# Patient Record
Sex: Male | Born: 1990 | Race: White | Hispanic: No | Marital: Single | Smoking: Never smoker
Health system: Southern US, Community
[De-identification: ages and names within clinical notes are randomized; demographics above are authoritative.]

---

## 2017-03-18 ENCOUNTER — Emergency Department (HOSPITAL_COMMUNITY)
Admission: EM | Admit: 2017-03-18 | Discharge: 2017-03-19 | Disposition: A | Discharge status: Home / Self Care | Payer: Self-pay | Attending: Emergency Medicine | Admitting: Emergency Medicine

## 2017-03-18 ENCOUNTER — Encounter (HOSPITAL_COMMUNITY): Payer: Self-pay | Admitting: *Deleted

## 2017-03-18 DIAGNOSIS — K529 Noninfective gastroenteritis and colitis, unspecified: Secondary | ICD-10-CM | POA: Insufficient documentation

## 2017-03-18 DIAGNOSIS — R195 Other fecal abnormalities: Secondary | ICD-10-CM | POA: Insufficient documentation

## 2017-03-18 LAB — COMPREHENSIVE METABOLIC PANEL
ALK PHOS: 56 U/L (ref 38–126)
ALT: 16 U/L — ABNORMAL LOW (ref 17–63)
ANION GAP: 14 (ref 5–15)
AST: 22 U/L (ref 15–41)
Albumin: 4.7 g/dL (ref 3.5–5.0)
BILIRUBIN TOTAL: 0.9 mg/dL (ref 0.3–1.2)
BUN: 7 mg/dL (ref 6–20)
CALCIUM: 10.1 mg/dL (ref 8.9–10.3)
CO2: 23 mmol/L (ref 22–32)
Chloride: 103 mmol/L (ref 101–111)
Creatinine, Ser: 1.1 mg/dL (ref 0.61–1.24)
GFR calc Af Amer: >60 mL/min (ref >60)
GLUCOSE: 103 mg/dL — AB (ref 65–99)
POTASSIUM: 3.9 mmol/L (ref 3.5–5.1)
Sodium: 140 mmol/L (ref 135–145)
TOTAL PROTEIN: 7.9 g/dL (ref 6.5–8.1)

## 2017-03-18 LAB — URINALYSIS, ROUTINE W REFLEX MICROSCOPIC
Bilirubin Urine: NEGATIVE
Glucose, UA: NEGATIVE mg/dL
Hgb urine dipstick: NEGATIVE
Ketones, ur: 20 mg/dL — AB
LEUKOCYTES UA: NEGATIVE
NITRITE: NEGATIVE
PH: 5 (ref 5.0–8.0)
Protein, ur: NEGATIVE mg/dL
Specific Gravity, Urine: 1.026 (ref 1.005–1.030)

## 2017-03-18 LAB — CBC
HEMATOCRIT: 40.7 % (ref 39.0–52.0)
HEMOGLOBIN: 14.1 g/dL (ref 13.0–17.0)
MCH: 31.1 pg (ref 26.0–34.0)
MCHC: 34.6 g/dL (ref 30.0–36.0)
MCV: 89.8 fL (ref 78.0–100.0)
Platelets: 374 10*3/uL (ref 150–400)
RBC: 4.53 MIL/uL (ref 4.22–5.81)
RDW: 13.1 % (ref 11.5–15.5)
WBC: 10.7 10*3/uL — AB (ref 4.0–10.5)

## 2017-03-18 NOTE — ED Triage Notes (Signed)
The pt is c/o diarrhea for one week a nd today he saw bright red blood in his stool no abd pain now  Only when he has a bm

## 2017-03-18 NOTE — ED Notes (Signed)
Pt reports that he has been having mucous type diarrhea. Pt reports seeing blood on the toilet paper when wipes.

## 2017-03-18 NOTE — ED Provider Notes (Signed)
[facility]-EMERGENCY DEPT Provider Note   CSN: [id number] Arrival date & time: 03/18/17  1922     History   Chief Complaint Chief Complaint  Patient presents with  . Diarrhea    HPI Jordan Esparza is a 26 y.o. male.  Patient presents with 9 day history of diarrhea, occasionally containing bright red blood and mucus, and associated with crampy abdominal pain, rated at 2/10, that resolves after BM. Patient states his stools began as "regular watery diarrhea" but then progressed to being bloody, and have contained mucus in the past 2-3 days. There is also blood when he wipes. The odor has not significantly changed. The stools occurred 7-8 times a day initially, but now have decreased to 1-2 times daily. He has no prior history of this. States he usually has 1-2 normal BM per day at baseline but does have constipation at times. He states he otherwise feels well. MOP states he has lost 30lbs in the past 6 months (patient states he has been under a lot of stress recently). Since onset, MOP has put him on a bland diet and he has taken Imodium and probiotics in an attempt to alleviate the symptoms. Patient denies eating anything out of the ordinary when symptoms began. He denies history of lactose intolerance, Celiac disease, recent antibiotic use, fever, nausea, vomiting, urinary symptoms, fatigue, recent travel, history of smoking or drinking or chronic NSAID use.      History reviewed. No pertinent past medical history.  There are no active problems to display for this patient.   History reviewed. No pertinent surgical history.     Home Medications    Prior to Admission medications   Medication Sig Start Date End Date Taking? Authorizing Provider  ciprofloxacin (CIPRO) 500 MG tablet Take 1 tablet (500 mg total) by mouth every 12 (twelve) hours. 03/19/17   Brieanne Mignone, PA  metroNIDAZOLE (FLAGYL) 500 MG tablet Take 1 tablet (500 mg total) by mouth 2 (two) times daily. 03/19/17   Dietrich Pates, PA    Family History No family history on file.  Social History Social History  Substance Use Topics  . Smoking status: Never Smoker  . Smokeless tobacco: Never Used  . Alcohol use No     Allergies   Patient has no known allergies.   Review of Systems Review of Systems  Constitutional: Positive for unexpected weight change. Negative for appetite change, chills and fever.  HENT: Negative for rhinorrhea, sneezing and sore throat.   Eyes: Negative for visual disturbance.  Respiratory: Negative for cough, chest tightness, shortness of breath and wheezing.   Cardiovascular: Negative for chest pain and palpitations.  Gastrointestinal: Positive for abdominal pain, blood in stool and diarrhea. Negative for constipation, nausea and vomiting.  Endocrine: Negative for polyuria.  Genitourinary: Negative for dysuria, hematuria and urgency.  Musculoskeletal: Negative for arthralgias and myalgias.  Skin: Negative for rash.  Neurological: Negative for dizziness, light-headedness and headaches.     Physical Exam Updated Vital Signs BP 129/84 (BP Location: Left Arm)   Pulse 99   Temp 98.5 F (36.9 C) (Oral)   Resp 16   Ht 6\' 3"  (1.905 m)   Wt 118.4 kg   SpO2 100%   BMI 32.64 kg/m   Physical Exam  Constitutional: He appears well-developed and well-nourished. No distress.  HENT:  Head: Normocephalic and atraumatic.  Nose: Nose normal.  Eyes: Conjunctivae and EOM are normal. Left eye exhibits no discharge. No scleral icterus.  Neck: Normal range of  motion. Neck supple.  Cardiovascular: Normal rate, regular rhythm, normal heart sounds and intact distal pulses.  Exam reveals no gallop and no friction rub.   No murmur heard. Pulmonary/Chest: Effort normal and breath sounds normal. No respiratory distress.  Abdominal: Soft. Bowel sounds are normal. He exhibits no distension. There is tenderness. There is no rebound, no guarding, no tenderness at McBurney's point and negative  Murphy's sign.  Mild TTP of R side of abdomen.  Genitourinary: Rectum normal.  Musculoskeletal: Normal range of motion. He exhibits no edema.  Neurological: He is alert. He exhibits normal muscle tone. Coordination normal.  Skin: Skin is warm and dry. No rash noted.  Psychiatric: He has a normal mood and affect.  Nursing note and vitals reviewed.    ED Treatments / Results  Labs (all labs ordered are listed, but only abnormal results are displayed) Labs Reviewed  COMPREHENSIVE METABOLIC PANEL - Abnormal; Notable for the following:       Result Value   Glucose, Bld 103 (*)    ALT 16 (*)    All other components within normal limits  CBC - Abnormal; Notable for the following:    WBC 10.7 (*)    All other components within normal limits  URINALYSIS, ROUTINE W REFLEX MICROSCOPIC - Abnormal; Notable for the following:    Ketones, ur 20 (*)    All other components within normal limits  POC OCCULT BLOOD, ED - Abnormal; Notable for the following:    Fecal Occult Bld POSITIVE (*)    All other components within normal limits    EKG  EKG Interpretation None       Radiology Ct Abdomen Pelvis W Contrast  Result Date: 03/19/2017 CLINICAL DATA:  Initial evaluation for bloody diarrhea, abdominal pain. EXAM: CT ABDOMEN AND PELVIS WITH CONTRAST TECHNIQUE: Multidetector CT imaging of the abdomen and pelvis was performed using the standard protocol following bolus administration of intravenous contrast. CONTRAST:  100mL ISOVUE-300 IOPAMIDOL (ISOVUE-300) INJECTION 61% COMPARISON:  None available. FINDINGS: Lower chest: Visualized lung bases are clear. Hepatobiliary: Liver within normal limits. Gallbladder within normal limits. No biliary dilatation. Pancreas: Pancreas within normal limits. Spleen: Spleen within normal limits. Adrenals/Urinary Tract: Adrenal glands are normal. Kidneys equal in size with symmetric enhancement. No nephrolithiasis, hydronephrosis, or focal enhancing renal mass. No  hydroureter. Bladder decompressed without acute abnormality. Stomach/Bowel: Stomach within normal limits. No evidence for bowel obstruction. Small bowel grossly unremarkable without definite inflammatory changes, although evaluation limited by motion. Appendix within normal limits. Descending colon diffusely decompressed with circumferential wall thickening. While the wall thickening may be related incomplete distension, possible acute colitis could also be considered, particularly given patient's symptoms. Vascular/Lymphatic: Normal intravascular enhancement seen throughout the intra-abdominal aorta and its branch vessels. No pathologically enlarged intra-abdominal or pelvic lymph nodes. Reproductive: Prostate normal. Other: No free air or fluid. Musculoskeletal: No acute osseous abnormality. No worrisome lytic or blastic osseous lesions. IMPRESSION: 1. Circumferential wall thickening about the descending colon. While this finding may be related incomplete distension, possible acute colitis could also be considered, particularly given patient's symptoms. 2. No other acute intra-abdominal or pelvic process identified. Electronically Signed   By: Rise MuBenjamin  McClintock M.D.   On: 03/19/2017 04:47    Procedures Procedures (including critical care time)  Medications Ordered in ED Medications  iopamidol (ISOVUE-300) 61 % injection (100 mLs  Contrast Given 03/19/17 0049)  iopamidol (ISOVUE-300) 61 % injection (100 mLs  Contrast Given 03/19/17 0406)     Initial Impression / Assessment and  Plan / ED Course  I have reviewed the triage vital signs and the nursing notes.  Pertinent labs & imaging results that were available during my care of the patient were reviewed by me and considered in my medical decision making (see chart for details).     Patient's history and symptoms are concerning for colitis vs. Infectious etiology. CBC and CMP are unremarkable. UA revealed ketones. Stool is hemoccult positive. CT  showed possible acute colitis vs. Incomplete distension. Likely acute colitis due to symptoms. Will be given Cipro and Flagyl and referred to GI for further evaluation. Patient was educated on return precautions and the importance of hydration.    Final Clinical Impressions(s) / ED Diagnoses   Final diagnoses:  Colitis    New Prescriptions New Prescriptions   CIPROFLOXACIN (CIPRO) 500 MG TABLET    Take 1 tablet (500 mg total) by mouth every 12 (twelve) hours.   METRONIDAZOLE (FLAGYL) 500 MG TABLET    Take 1 tablet (500 mg total) by mouth 2 (two) times daily.     [place], [place] 03/19/17 1610    Nira Conn, MD 03/19/17 [phone]

## 2017-03-19 ENCOUNTER — Emergency Department (HOSPITAL_COMMUNITY): Payer: Self-pay

## 2017-03-19 ENCOUNTER — Encounter (HOSPITAL_COMMUNITY): Payer: Self-pay | Admitting: Radiology

## 2017-03-19 LAB — POC OCCULT BLOOD, ED: Fecal Occult Bld: POSITIVE — AB

## 2017-03-19 MED ORDER — CIPROFLOXACIN HCL 500 MG PO TABS: 500.0000 mg | ORAL_TABLET | Freq: Two times a day (BID) | ORAL | 0 refills | Status: DC

## 2017-03-19 MED ORDER — METRONIDAZOLE 500 MG PO TABS: 500.0000 mg | ORAL_TABLET | Freq: Two times a day (BID) | ORAL | 0 refills | Status: DC

## 2017-03-19 MED ORDER — IOPAMIDOL (ISOVUE-300) INJECTION 61%
INTRAVENOUS | Status: AC
  Administered 2017-03-19: 100 mL
  Filled 2017-03-19: qty 100

## 2017-03-19 MED ORDER — METRONIDAZOLE 500 MG PO TABS: 500.0000 mg | ORAL_TABLET | Freq: Two times a day (BID) | ORAL | 0 refills | Status: AC

## 2017-03-19 MED ORDER — CIPROFLOXACIN HCL 500 MG PO TABS: 500.0000 mg | ORAL_TABLET | Freq: Two times a day (BID) | ORAL | 0 refills | Status: AC

## 2017-03-19 NOTE — ED Notes (Signed)
Received a call from CT that the patient would not lay down, would not sit still, and would not allow them to do the scan.

## 2017-03-19 NOTE — Discharge Instructions (Addendum)
Begin taking ciprofloxacin and Flagyl for 10 days. Schedule appointment with GI specialist for further evaluation and treatment. Increase fluids and adequate food intake. Return to ED for severe diarrhea with large amounts of stool and blood, feelings of syncope or vomiting up blood.

## 2018-03-10 IMAGING — CT CT ABD-PELV W/ CM
2 of 4 series · 9 of 46 positions shown, 10 images · IV contrast (Iodine)
Comparison: None available.

CLINICAL DATA: Initial evaluation for bloody diarrhea, abdominal
pain.

EXAM:
CT ABDOMEN AND PELVIS WITH CONTRAST
TECHNIQUE: Multidetector CT imaging of the abdomen and pelvis was performed
using the standard protocol following bolus administration of
intravenous contrast.
CONTRAST:  100mL ICXHCF-Y66 IOPAMIDOL (ICXHCF-Y66) INJECTION 61%

[Series 201: routine, idose (2) · axial · 0.78mm/px · z∈[-587,-157]mm · 6 of 106 slices shown, 7 images]
[im 10/106  soft-tissue]
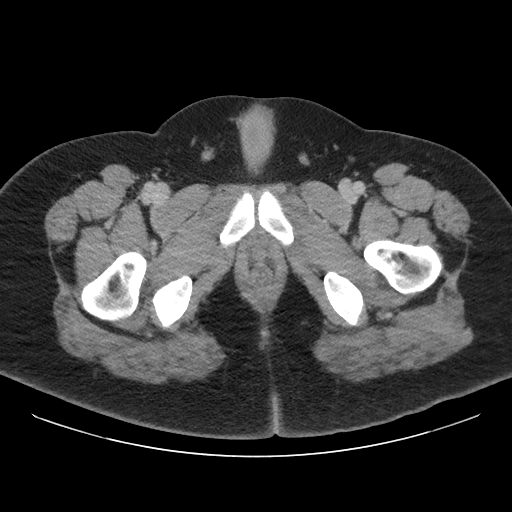
[im 10/106  bone]
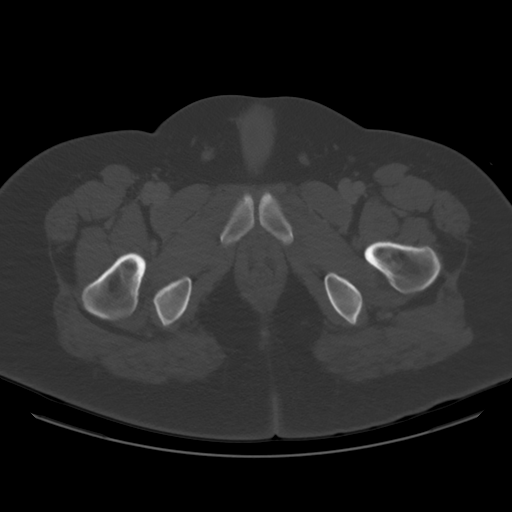
[im 28/106  soft-tissue]
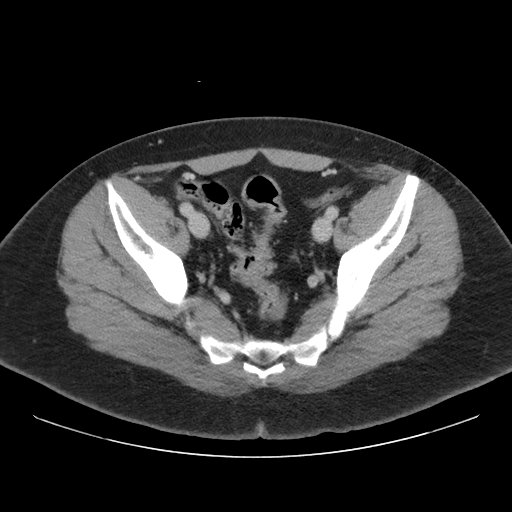
[im 46/106  soft-tissue]
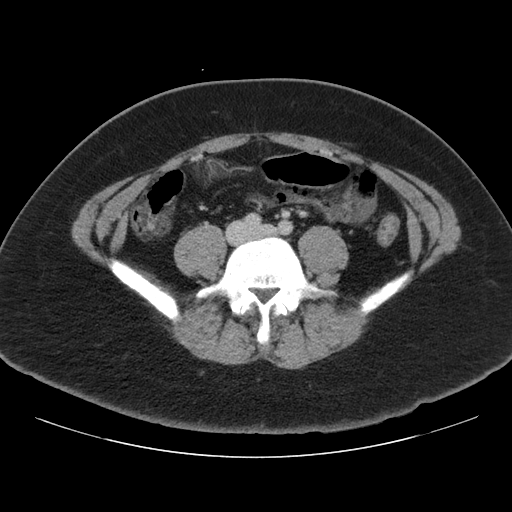
[im 60/106  soft-tissue]
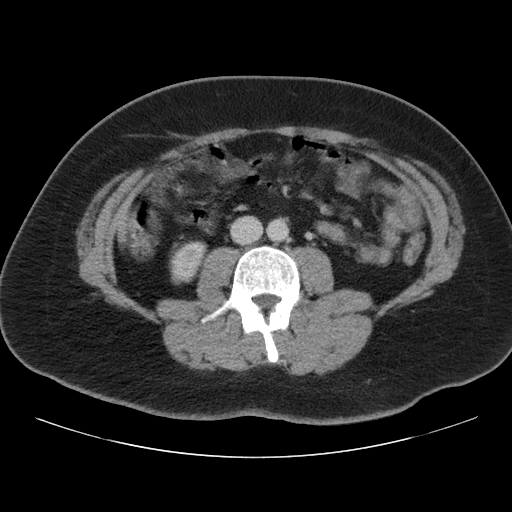
[im 78/106  soft-tissue]
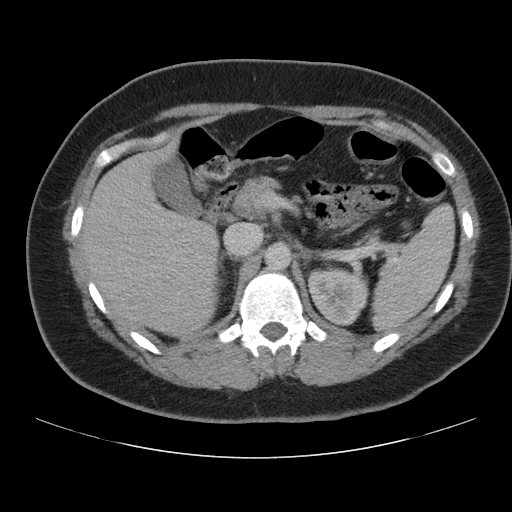
[im 96/106  soft-tissue]
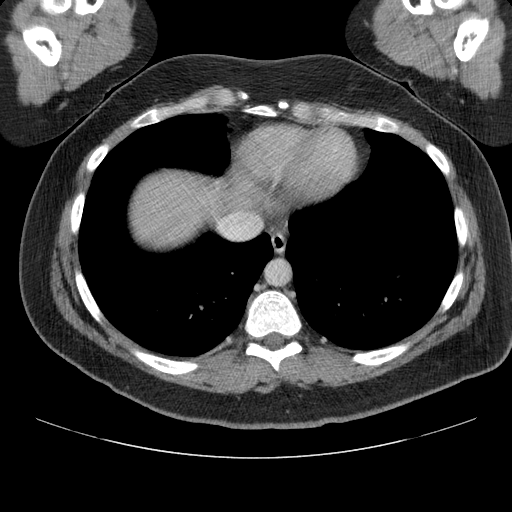

[Series 203: coronals, idose (2) · coronal · 0.45mm/px · 3 of 144 slices shown]
[im 48/144  soft-tissue]
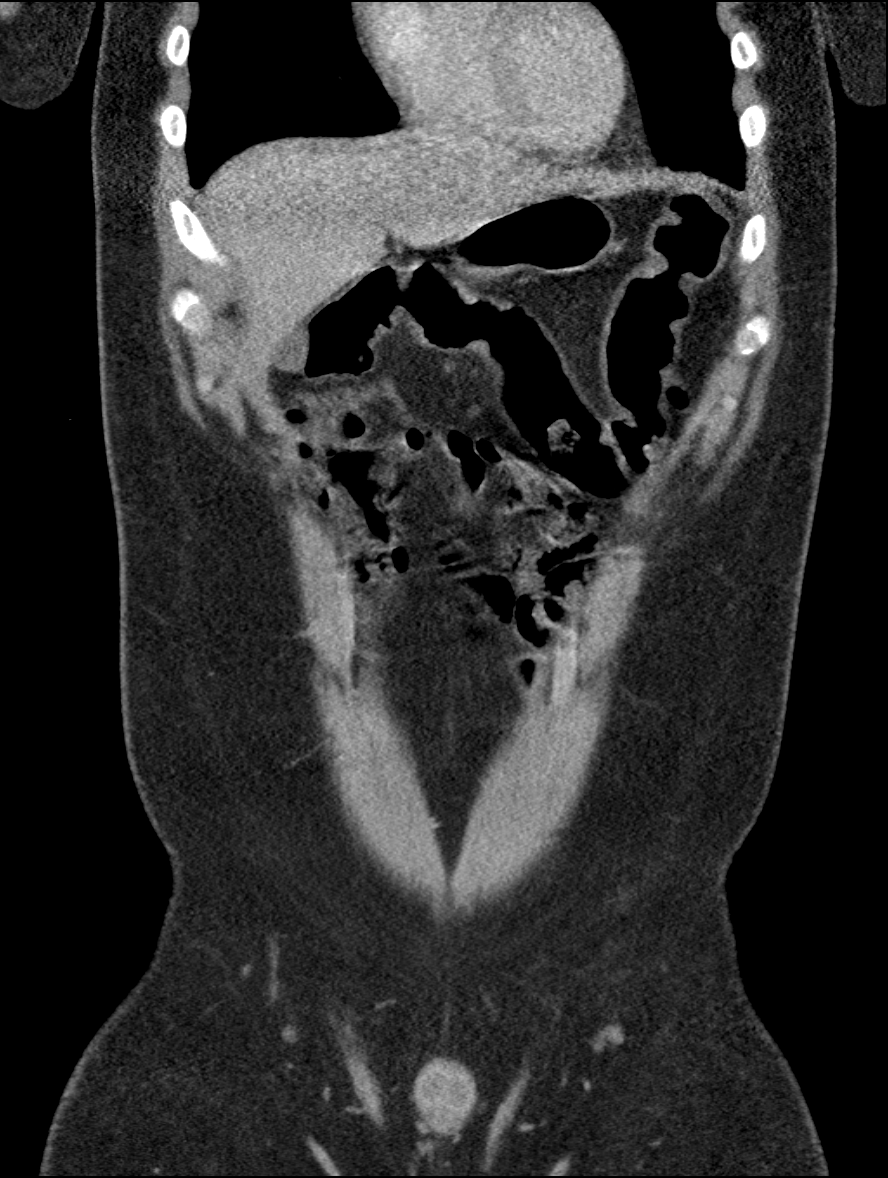
[im 64/144  soft-tissue]
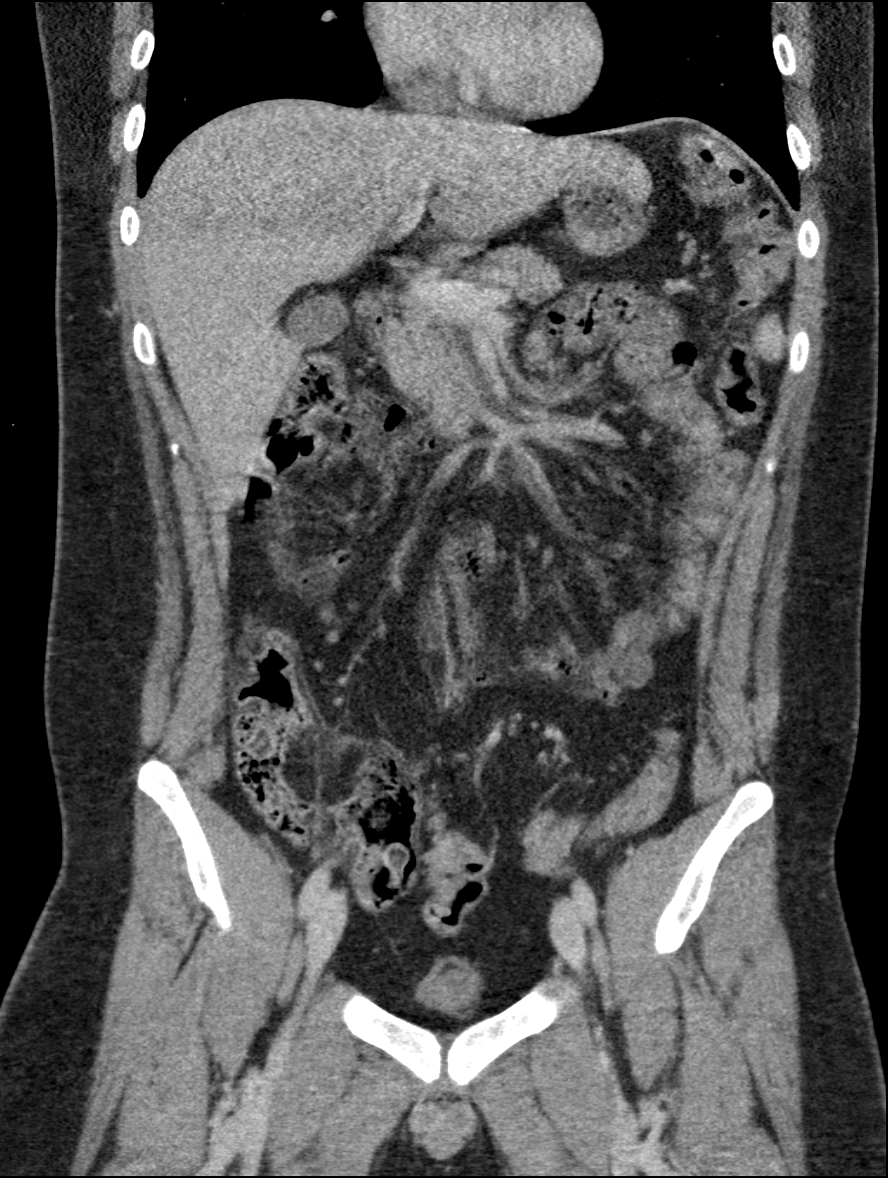
[im 80/144  soft-tissue]
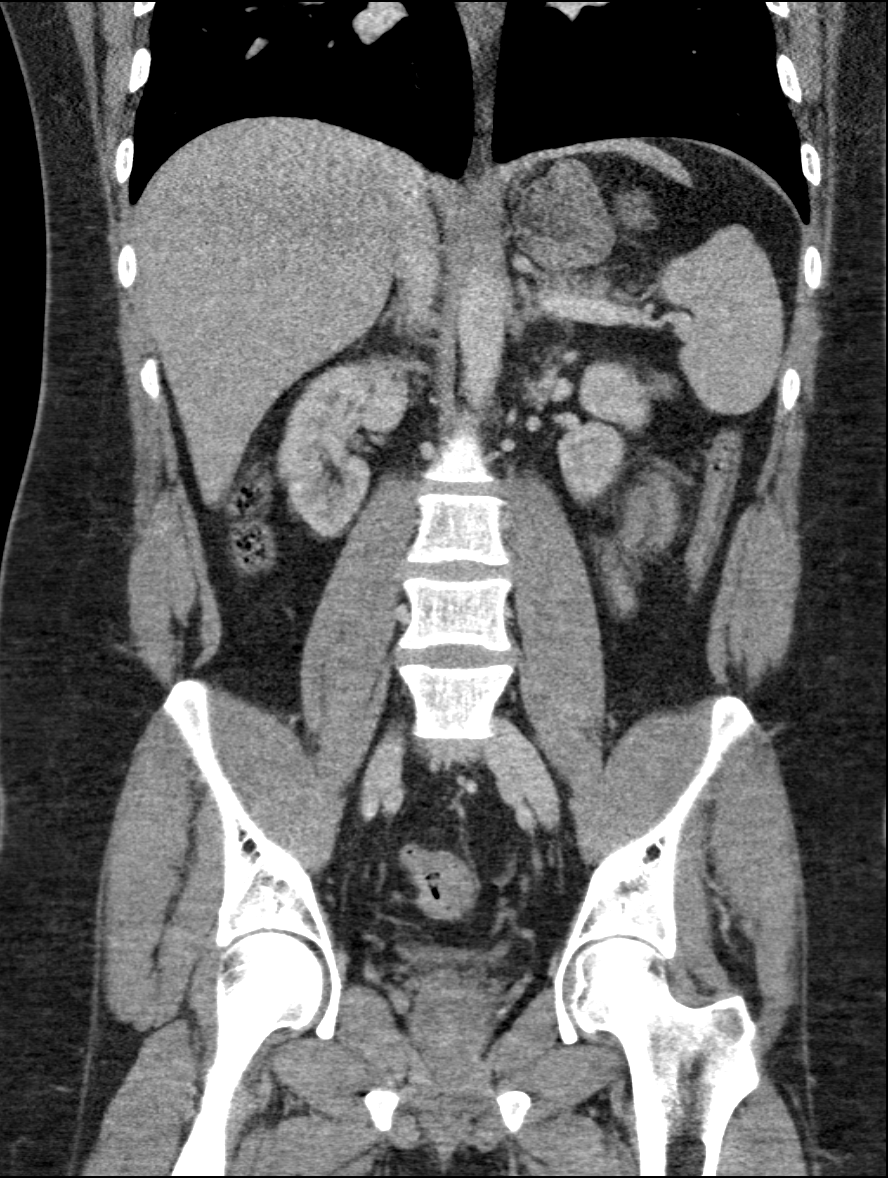

[9 of 46 positions shown; findings below may reference images not displayed]

FINDINGS: Lower chest: Visualized lung bases are clear.

Hepatobiliary: Liver within normal limits. Gallbladder within normal
limits. No biliary dilatation.

Pancreas: Pancreas within normal limits.

Spleen: Spleen within normal limits.

Adrenals/Urinary Tract: Adrenal glands are normal. Kidneys equal in
size with symmetric enhancement. No nephrolithiasis, hydronephrosis,
or focal enhancing renal mass. No hydroureter. Bladder decompressed
without acute abnormality.

Stomach/Bowel: Stomach within normal limits. No evidence for bowel
obstruction. Small bowel grossly unremarkable without definite
inflammatory changes, although evaluation limited by motion.
Appendix within normal limits. Descending colon diffusely
decompressed with circumferential wall thickening. While the wall
thickening may be related incomplete distension, possible acute
colitis could also be considered, particularly given patient's
symptoms.

Vascular/Lymphatic: Normal intravascular enhancement seen throughout
the intra-abdominal aorta and its branch vessels. No pathologically
enlarged intra-abdominal or pelvic lymph nodes.

Reproductive: Prostate normal.

Other: No free air or fluid.

Musculoskeletal: No acute osseous abnormality. No worrisome lytic or
blastic osseous lesions.
IMPRESSION: 1. Circumferential wall thickening about the descending colon. While
this finding may be related incomplete distension, possible acute
colitis could also be considered, particularly given patient's
symptoms.
2. No other acute intra-abdominal or pelvic process identified.
